# Patient Record
Sex: Female | Born: 1990 | Race: White | Hispanic: No | Marital: Single | Smoking: Never smoker
Health system: Southern US, Community
[De-identification: ages and names within clinical notes are randomized; demographics above are authoritative.]

---
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
## 2013-11-29 ENCOUNTER — Ambulatory Visit: Payer: BC Managed Care – PPO

## 2013-11-29 ENCOUNTER — Ambulatory Visit (INDEPENDENT_AMBULATORY_CARE_PROVIDER_SITE_OTHER): Payer: BC Managed Care – PPO | Admitting: Family Medicine

## 2013-11-29 VITALS — BP 94/62 | HR 76 | Temp 98.2°F | Resp 16 | Ht 66.0 in | Wt 110.0 lb

## 2013-11-29 DIAGNOSIS — M25539 Pain in unspecified wrist: Secondary | ICD-10-CM

## 2013-11-29 DIAGNOSIS — M79641 Pain in right hand: Secondary | ICD-10-CM

## 2013-11-29 DIAGNOSIS — M25531 Pain in right wrist: Secondary | ICD-10-CM

## 2013-11-29 DIAGNOSIS — M79609 Pain in unspecified limb: Secondary | ICD-10-CM

## 2013-11-29 DIAGNOSIS — S62109A Fracture of unspecified carpal bone, unspecified wrist, initial encounter for closed fracture: Secondary | ICD-10-CM

## 2013-11-29 NOTE — Progress Notes (Signed)
Chief Complaint:  Chief Complaint  Patient presents with  . Wrist Injury    right-fell 1 week ago and tried to catch herself with her wrist    HPI: Tonya Martinez is a 23 y.o. female who is here for right wrist injury. She tripped and fell on her hand on 11/23/2013. Her wrist has been weak and started hurting the more she tries to use it. She fell and extended her hand put ut. Pain is sharp pain at wrist when typing but normally 2/10 dull pain radiates to hand and forearm. She has broken that wrist in the 3rd grade managed with casting, broke her elbow on  right side 5 years elbow. Broken both wrists in the past due to traumatic injury. She has tried RICE and wrist brace. Occ weakness, numbness tingling. She has pain with typing. She denies osteoporosis/osteopenia, eating disorder  History reviewed. No pertinent past medical history. Past Surgical History  Procedure Laterality Date  . Tonsillectomy  2009  . Ankle arthroscopy with repair subluxing tendon  2006   History   Social History  . Marital Status: Single    Spouse Name: N/A    Number of Children: N/A  . Years of Education: N/A   Social History Main Topics  . Smoking status: Never Smoker   . Smokeless tobacco: None  . Alcohol Use: 0.6 oz/week    1 Glasses of wine per week  . Drug Use: No  . Sexual Activity: None   Other Topics Concern  . None   Social History Narrative  . None   Family History  Problem Relation Age of Onset  . Hyperlipidemia Mother    Allergies  Allergen Reactions  . Penicillins Hives   Prior to Admission medications   Medication Sig Start Date End Date Taking? Authorizing Provider  norethindrone-ethinyl estradiol (NECON,BREVICON,MODICON) 0.5-35 MG-MCG tablet Take 1 tablet by mouth daily.   Yes Historical Provider, MD     ROS: The patient denies fevers, chills, night sweats, unintentional weight loss, chest pain, palpitations, wheezing, dyspnea on exertion, nausea, vomiting,  abdominal pain, dysuria, hematuria, melena, numbness, weakness, or tingling.   All other systems have been reviewed and were otherwise negative with the exception of those mentioned in the HPI and as above.    PHYSICAL EXAM: Filed Vitals:   11/29/13 0848  BP: 94/62  Pulse: 76  Temp: 98.2 F (36.8 C)  Resp: 16   Filed Vitals:   11/29/13 0848  Height: 5\' 6"  (1.676 m)  Weight: 110 lb (49.896 kg)   Body mass index is 17.76 kg/(m^2).  General: Alert, no acute distress HEENT:  Normocephalic, atraumatic, oropharynx patent. EOMI, PERRLA Cardiovascular:  Regular rate and rhythm, no rubs murmurs or gallops.  No Carotid bruits, radial pulse intact. No pedal edema.  Respiratory: Clear to auscultation bilaterally.  No wheezes, rales, or rhonchi.  No cyanosis, no use of accessory musculature GI: No organomegaly, abdomen is soft and non-tender, positive bowel sounds.  No masses. Skin: No rashes. Neurologic: Facial musculature symmetric. Psychiatric: Patient is appropriate throughout our interaction. Lymphatic: No cervical lymphadenopathy Musculoskeletal: Gait intact. Right wrist tenderness, + radial pulse Decrease ROm due to pain mostly in flexiona nd extension 5/5 sterngth with ulnar and radial deviation   LABS: No results found for this or any previous visit.   EKG/XRAY:   [facility] ? Acute Distal radius fracture vs chrinic changes from prior wrist fracture  ASSESSMENT/PLAN: Encounter  Diagnoses  Name Primary?  . Right hand pain Yes  . Right wrist pain   . Wrist fracture    Pleasant 23 y/o female with possible right distal radius fracture. Will go ahead and splint her with sugar tong.  She is right hand dominant. Limit use of right hand/arm Sugar tong ,Sling xrays and official report given to patient Will refer to ortho, consider screening for osteoporosis RICE Tylenol/ibuprofen   Gross sideeffects, risk and benefits, and alternatives of  medications d/w patient. Patient is aware that all medications have potential sideeffects and we are unable to predict every sideeffect or drug-drug interaction that may occur.  LE, THAO PHUONG, DO 11/29/2013 10:31 AM   D/w patient imaging results IMPRESSION:  1. There is no evidence of an acute fracture of the carpal bones or  metacarpals or phalanges.  2. There may be an impacted fracture of the distal right radial  metaphysis. The ulnar styloid appears intact.

## 2014-03-06 ENCOUNTER — Other Ambulatory Visit (HOSPITAL_COMMUNITY)
Admission: RE | Admit: 2014-03-06 | Discharge: 2014-03-06 | Disposition: A | Discharge status: Home / Self Care | Payer: BC Managed Care – PPO | Source: Ambulatory Visit | Attending: Family Medicine | Admitting: Family Medicine

## 2014-03-06 ENCOUNTER — Other Ambulatory Visit: Payer: Self-pay | Admitting: Family Medicine

## 2014-03-06 DIAGNOSIS — Z124 Encounter for screening for malignant neoplasm of cervix: Secondary | ICD-10-CM | POA: Diagnosis present

## 2014-03-08 LAB — CYTOLOGY - PAP

## 2014-08-22 ENCOUNTER — Ambulatory Visit (INDEPENDENT_AMBULATORY_CARE_PROVIDER_SITE_OTHER): Payer: BC Managed Care – PPO | Admitting: Family Medicine

## 2014-08-22 VITALS — BP 122/76 | HR 66 | Temp 98.7°F | Resp 17 | Ht 67.0 in | Wt 113.0 lb

## 2014-08-22 DIAGNOSIS — M545 Low back pain: Secondary | ICD-10-CM

## 2014-08-22 DIAGNOSIS — M62830 Muscle spasm of back: Secondary | ICD-10-CM

## 2014-08-22 MED ORDER — CYCLOBENZAPRINE HCL 10 MG PO TABS: 10.0000 mg | ORAL_TABLET | Freq: Two times a day (BID) | ORAL | Status: DC | PRN

## 2014-08-22 NOTE — Patient Instructions (Signed)
We are going to treat you for a back spasm with flexeril (muscle relaxer) which you can use as needed.  If a whole pill is too strong try a 1/2 pill You can continue to use ibuprofen as needed.  Also use heat and move around frequently.   Let me know if you do not feel better soon!

## 2014-08-22 NOTE — Progress Notes (Signed)
[facility] [address], [place] [address] [phone]  Date:  08/22/2014   Name:  Tonya Martinez   DOB:  07/31/1991   MRN:  [id number]  PCP:  No PCP Per Patient    Chief Complaint: Back Pain   History of Present Illness:  Tonya Martinez is a 23 y.o. very pleasant female patient who presents with the following:  She is here today with lower back pain that she first noted upon awakening Monday am (today is Wednesday).   No different activities over the weekend- NKI.   The pain is more in the right lower back, and she has some slight tingling and "pressure" in her right leg.   She has not noted any weakness in her legs.  No urinary sx like dyrusia or frequency.   No bowel or bladder control issues.   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   She is married, never a smoker.  Work is winding down for the holidays so missing some work is not an isseu  There are no active problems to display for this patient.   No past medical history on file.  Past Surgical History  Procedure Laterality Date  . Tonsillectomy  2009  . Ankle arthroscopy with repair subluxing tendon  2006    History  Substance Use Topics  . Smoking status: Never Smoker   . Smokeless tobacco: Not on file  . Alcohol Use: 0.6 oz/week    1 Glasses of wine per week    Family History  Problem Relation Age of Onset  . Hyperlipidemia Mother     Allergies  Allergen Reactions  . Penicillins Hives    Medication list has been reviewed and updated.  Current Outpatient Prescriptions on File Prior to Visit  Medication Sig Dispense Refill  . norethindrone-ethinyl estradiol (NECON,BREVICON,MODICON) 0.5-35 MG-MCG tablet Take 1 tablet by mouth daily.     No current facility-administered medications on file prior to visit.    Review of Systems:  As per HPI- otherwise negative.   Physical  Examination: Filed Vitals:   08/22/14 0829  BP: 122/76  Pulse: 66  Temp: 98.7 F (37.1 C)  Resp: 17   Filed Vitals:   08/22/14 0829  Height: 5\' 7"  (1.702 m)  Weight: 113 lb (51.256 kg)   Body mass index is 17.69 kg/(m^2). Ideal Body Weight: Weight in (lb) to have BMI = 25: 159.3  GEN: WDWN, NAD, Non-toxic, A & O x 3 HEENT: Atraumatic, Normocephalic. Neck supple. No masses, No LAD. Ears and Nose: No external deformity. CV: RRR, No M/G/R. No JVD. No thrill. No extra heart sounds. PULM: CTA B, no wheezes, crackles, rhonchi. No retractions. No resp. distress. No accessory muscle use. ABD: S, NT, ND. EXTR: No c/c/e NEURO Normal gait.  PSYCH: Normally interactive. Conversant. Not depressed or anxious appearing.  Calm demeanor.  Right lower back is in spasm and tender.  restricted lumbar flexion and extension.  Negative SLR bilaterally, normal BLE strength, sensation and DTR   Assessment and Plan: Back spasm - Plan: cyclobenzaprine (FLEXERIL) 10 MG tablet  Low back pain without sciatica, unspecified back pain laterality - Plan: cyclobenzaprine (FLEXERIL) 10 MG tablet  Lower back strain- flexeril as needed, heat and ibuprofen prn.  cautined regarding sedation with flexeril  she will follow-up if not better soon- Sooner if worse.     Signed  Lamar Blinks, MD

## 2014-10-21 ENCOUNTER — Ambulatory Visit (INDEPENDENT_AMBULATORY_CARE_PROVIDER_SITE_OTHER): Payer: BC Managed Care – PPO

## 2014-10-21 ENCOUNTER — Ambulatory Visit (INDEPENDENT_AMBULATORY_CARE_PROVIDER_SITE_OTHER): Payer: BC Managed Care – PPO | Admitting: Internal Medicine

## 2014-10-21 ENCOUNTER — Other Ambulatory Visit: Payer: Self-pay | Admitting: Internal Medicine

## 2014-10-21 VITALS — BP 106/62 | HR 68 | Temp 98.2°F | Resp 16 | Ht 66.0 in | Wt 115.1 lb

## 2014-10-21 DIAGNOSIS — M79671 Pain in right foot: Secondary | ICD-10-CM

## 2014-10-21 MED ORDER — MELOXICAM 15 MG PO TABS: 15.0000 mg | ORAL_TABLET | Freq: Every day | ORAL | Status: AC

## 2014-10-21 NOTE — Progress Notes (Signed)
   Subjective:  This chart was scribed for Tonya Siaobert Dashonda Bonneau, MD by Haywood PaoNadim Abu Hashem, ED Scribe at [facility].The patient was seen in exam room 04 and the patient's care was started at 12:43 PM.   Patient ID: Tonya Martinez, female    DOB: 11/12/1990, 24 y.o.   MRN: [id number] Chief Complaint  Patient presents with  . Foot Pain    Right-No Injury that she knows of   HPI HPI Comments: Tonya Martinez is a 24 y.o. female who [facility] of right foot pain which began 4 days ago. The pain began in the middle of her foot, it has since radiated to her ankle and gradually worsened. Pain worsens when applying pressure to her foot. She woke up last night due to pain without pressure. She does not exercise, no new shoes, no long walks, nothing out of the ordinary to cause the trauma. Works at [employer], nothing that involves excessive standing or walking.  Review of Systems  Musculoskeletal: Positive for myalgias.      Objective:   Physical Exam  Constitutional: She is oriented to person, place, and time. She appears well-developed and well-nourished. No distress.  HENT:  Head: Normocephalic and atraumatic.  Eyes: Pupils are equal, round, and reactive to light.  Neck: Normal range of motion.  Cardiovascular: Normal rate and regular rhythm.   Pulmonary/Chest: Effort normal. No respiratory distress.  Musculoskeletal: Normal range of motion.  The right foot is not swollen but is TTP in the plantar fascia inserts at the calcaneous. No redness. Tenderness with foot dorsal flexion She is also exquisitely tender over the mid part of the metatarsal #4 dorsally//no swelling/squeeze test negative Achilles is nontender Ankle mortise is intact without pain on range of motion  Neurological: She is alert and oriented to person, place, and time.  Skin: Skin is warm and dry.  Psychiatric: She has a normal mood and affect. Her behavior is normal.  Nursing note and  vitals reviewed.    [facility] reading (PRIMARY) by  Dr. Merla Richesoolittle= no fracture or heel spur   Assessment & Plan:  Pain, heel, right - Plan: DG Os Calcis Right, CANCELED: DG Foot 2 Views Right  Right foot pain - Plan: CANCELED: DG Foot 2 Views Right   This should represent tendinitis of the foot and should respond to meloxicam plus ice plus stretching I have completed the patient encounter in its entirety as documented by the scribe, with editing by me where necessary. Lizbet Cirrincione P. Merla Richesoolittle, M.D.

## 2014-12-26 ENCOUNTER — Other Ambulatory Visit: Payer: BC Managed Care – PPO

## 2014-12-26 ENCOUNTER — Other Ambulatory Visit: Payer: Self-pay | Admitting: Physician Assistant

## 2014-12-26 DIAGNOSIS — R0602 Shortness of breath: Secondary | ICD-10-CM

## 2014-12-26 DIAGNOSIS — R079 Chest pain, unspecified: Secondary | ICD-10-CM

## 2015-01-24 ENCOUNTER — Ambulatory Visit (INDEPENDENT_AMBULATORY_CARE_PROVIDER_SITE_OTHER): Payer: BC Managed Care – PPO | Admitting: Emergency Medicine

## 2015-01-24 VITALS — BP 98/62 | HR 71 | Temp 98.1°F | Resp 16 | Ht 66.5 in | Wt 111.5 lb

## 2015-01-24 DIAGNOSIS — S20229A Contusion of unspecified back wall of thorax, initial encounter: Secondary | ICD-10-CM

## 2015-01-24 MED ORDER — HYDROCODONE-ACETAMINOPHEN 5-325 MG PO TABS: 1.0000 | ORAL_TABLET | ORAL | Status: AC | PRN

## 2015-01-24 NOTE — Progress Notes (Signed)
[facility] [address], [place] [address] [phone]  Date:  01/24/2015   Name:  Tonya MiresKaitlyn A Martinez   DOB:  03/17/1991   MRN:  [id number]  PCP:  No PCP Per Patient    Chief Complaint: Back Pain   History of Present Illness:  [name] is a 24 y.o. very pleasant female patient who presents with the following:  Crawling under the bed and while coming out she scraped her low back Has persistent pain that interferes with her sleeping and activities Non radiating.  No neuro symptoms. No improvement with over the counter medications or other home remedies.  Denies other complaint or health concern today.   There are no active problems to display for this patient.   History reviewed. No pertinent past medical history.  Past Surgical History  Procedure Laterality Date  . Tonsillectomy  2009  . Ankle arthroscopy with repair subluxing tendon  2006    History  Substance Use Topics  . Smoking status: Never Smoker   . Smokeless tobacco: Never Used  . Alcohol Use: 0.6 oz/week    1 Glasses of wine per week    Family History  Problem Relation Age of Onset  . Hyperlipidemia Mother     Allergies  Allergen Reactions  . Penicillins Hives    Medication list has been reviewed and updated.  Current Outpatient Prescriptions on File Prior to Visit  Medication Sig Dispense Refill  . meloxicam (MOBIC) 15 MG tablet Take 1 tablet (15 mg total) by mouth daily. 15 tablet 0  . norethindrone-ethinyl estradiol (NECON,BREVICON,MODICON) 0.5-35 MG-MCG tablet Take 1 tablet by mouth daily.     No current facility-administered medications on file prior to visit.    Review of Systems:  Review of Systems  Constitutional: Negative for fever, chills and fatigue.  HENT: Negative for congestion, ear pain, hearing loss, postnasal drip, rhinorrhea and sinus pressure.   Eyes: Negative for discharge and redness.  Respiratory: Negative for cough, shortness of  breath and wheezing.   Cardiovascular: Negative for chest pain and leg swelling.  Gastrointestinal: Negative for nausea, vomiting, abdominal pain, constipation and blood in stool.  Genitourinary: Negative for dysuria, urgency and frequency.  Musculoskeletal: Negative for neck stiffness.  Skin: Negative for rash.  Neurological: Negative for seizures, weakness and headaches.   Physical Examination: Filed Vitals:   01/24/15 1730  BP: 98/62  Pulse: 71  Temp: 98.1 F (36.7 C)  Resp: 16   Filed Vitals:   01/24/15 1730  Height: 5' 6.5" (1.689 m)  Weight: 111 lb 8 oz (50.576 kg)   Body mass index is 17.73 kg/(m^2). Ideal Body Weight: Weight in (lb) to have BMI = 25: 156.9   GEN: WDWN, NAD, Non-toxic, Alert & Oriented x 3 HEENT: Atraumatic, Normocephalic.  Ears and Nose: No external deformity. EXTR: No clubbing/cyanosis/edema NEURO: Normal gait.  PSYCH: Normally interactive. Conversant. Not depressed or anxious appearing.  Calm demeanor.  Abrasion mid back  Tender paraspinous muscles  Assessment and Plan: 1. Contusion, back, unspecified laterality, initial encounter Local heat - HYDROcodone-acetaminophen (NORCO) 5-325 MG per tablet; Take 1-2 tablets by mouth every 4 (four) hours as needed.  Dispense: 30 tablet; Refill: 0    Signed Phillips OdorJeffery Kearie Mennen, MD

## 2015-01-24 NOTE — Patient Instructions (Signed)
Contusion °A contusion is a deep bruise. Contusions are the result of an injury that caused bleeding under the skin. The contusion may turn blue, purple, or yellow. Minor injuries will give you a painless contusion, but more severe contusions may stay painful and swollen for a few weeks.  °CAUSES  °A contusion is usually caused by a blow, trauma, or direct force to an area of the body. °SYMPTOMS  °· Swelling and redness of the injured area. °· Bruising of the injured area. °· Tenderness and soreness of the injured area. °· Pain. °DIAGNOSIS  °The diagnosis can be made by taking a history and physical exam. An X-ray, CT scan, or MRI may be needed to determine if there were any associated injuries, such as fractures. °TREATMENT  °Specific treatment will depend on what area of the body was injured. In general, the best treatment for a contusion is resting, icing, elevating, and applying cold compresses to the injured area. Over-the-counter medicines may also be recommended for pain control. Ask your caregiver what the best treatment is for your contusion. °HOME CARE INSTRUCTIONS  °· Put ice on the injured area. °¨ Put ice in a plastic bag. °¨ Place a towel between your skin and the bag. °¨ Leave the ice on for 15-20 minutes, 3-4 times a day, or as directed by your health care provider. °· Only take over-the-counter or prescription medicines for pain, discomfort, or fever as directed by your caregiver. Your caregiver may recommend avoiding anti-inflammatory medicines (aspirin, ibuprofen, and naproxen) for 48 hours because these medicines may increase bruising. °· Rest the injured area. °· If possible, elevate the injured area to reduce swelling. °SEEK IMMEDIATE MEDICAL CARE IF:  °· You have increased bruising or swelling. °· You have pain that is getting worse. °· Your swelling or pain is not relieved with medicines. °MAKE SURE YOU:  °· Understand these instructions. °· Will watch your condition. °· Will get help right  away if you are not doing well or get worse. °Document Released: 06/10/2005 Document Revised: 09/05/2013 Document Reviewed: 07/06/2011 °[employer]® Patient Information ©2015 [employer]. This information is not intended to replace advice given to you by your health care provider. Make sure you discuss any questions you have with your health care provider. ° °

## 2016-08-21 IMAGING — CR DG FOOT COMPLETE 3+V*R*
3 series · 3 of 3 positions shown · non-contrast
Comparison: Right calcaneus radiographs - earlier same day

CLINICAL DATA: Right foot and heel pain.

EXAM:
RIGHT FOOT COMPLETE - 3+ VIEW

[AP]
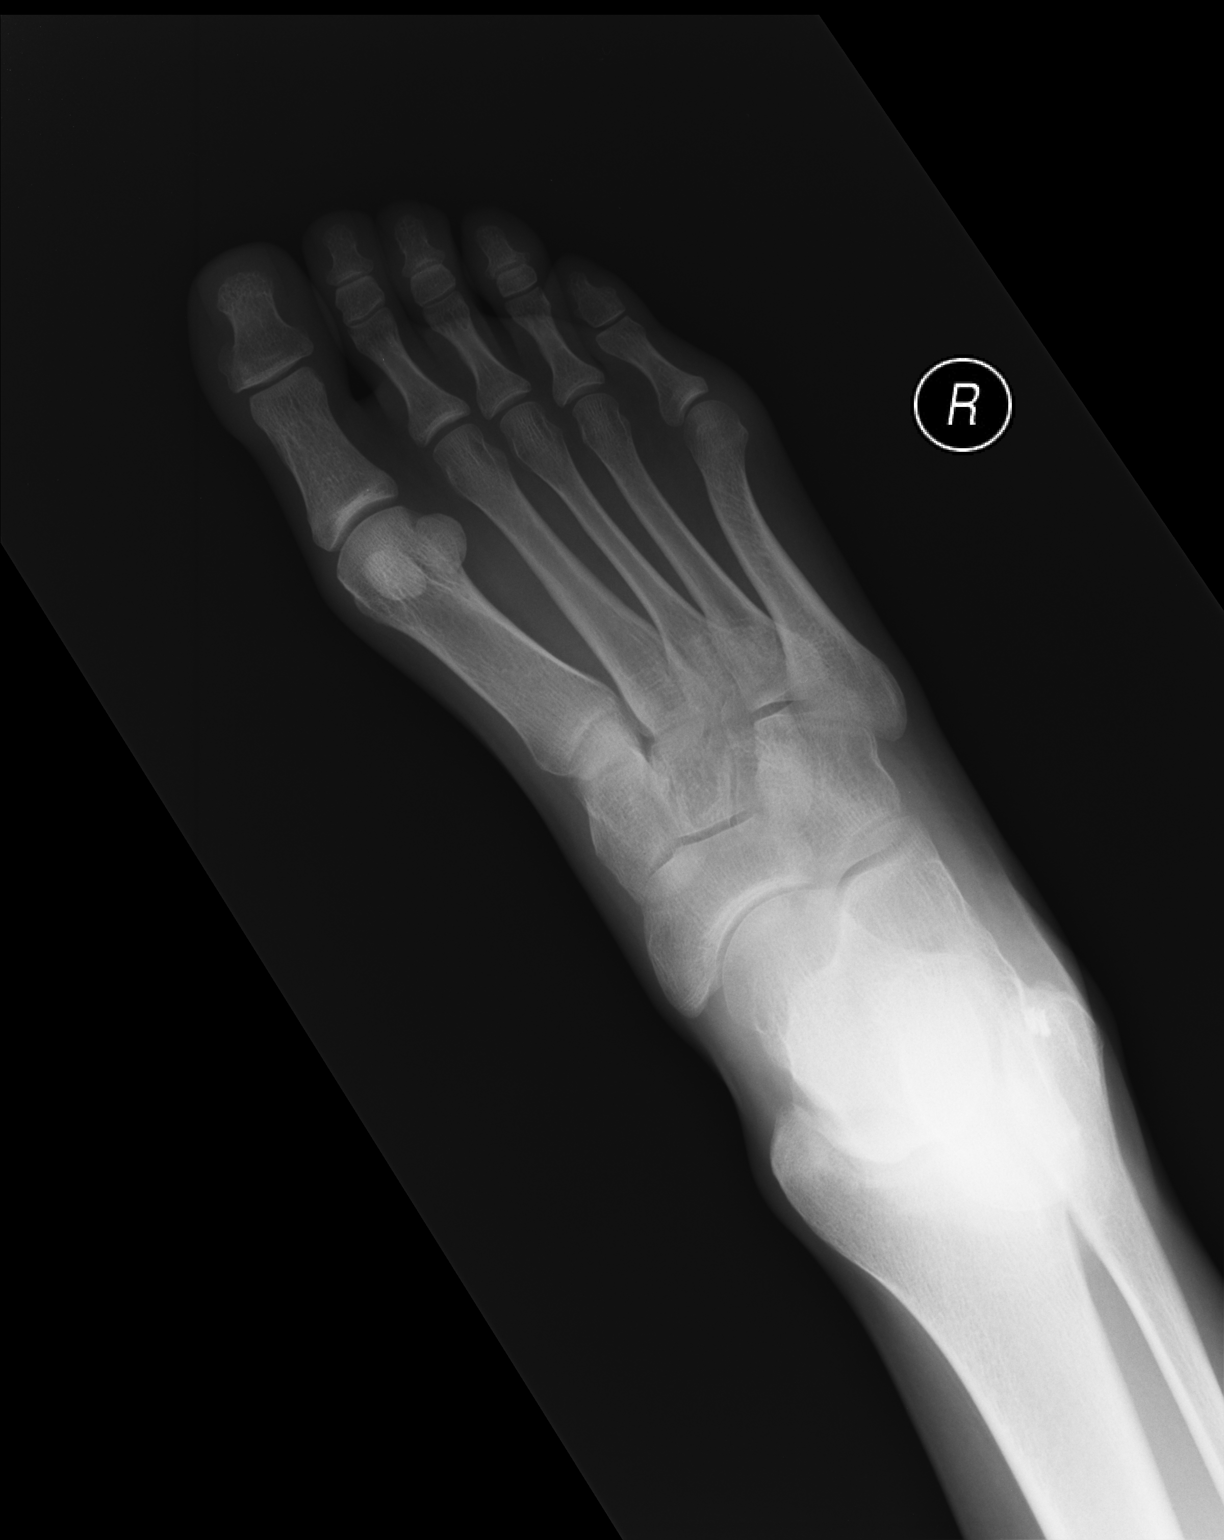

[ap obl int rot]
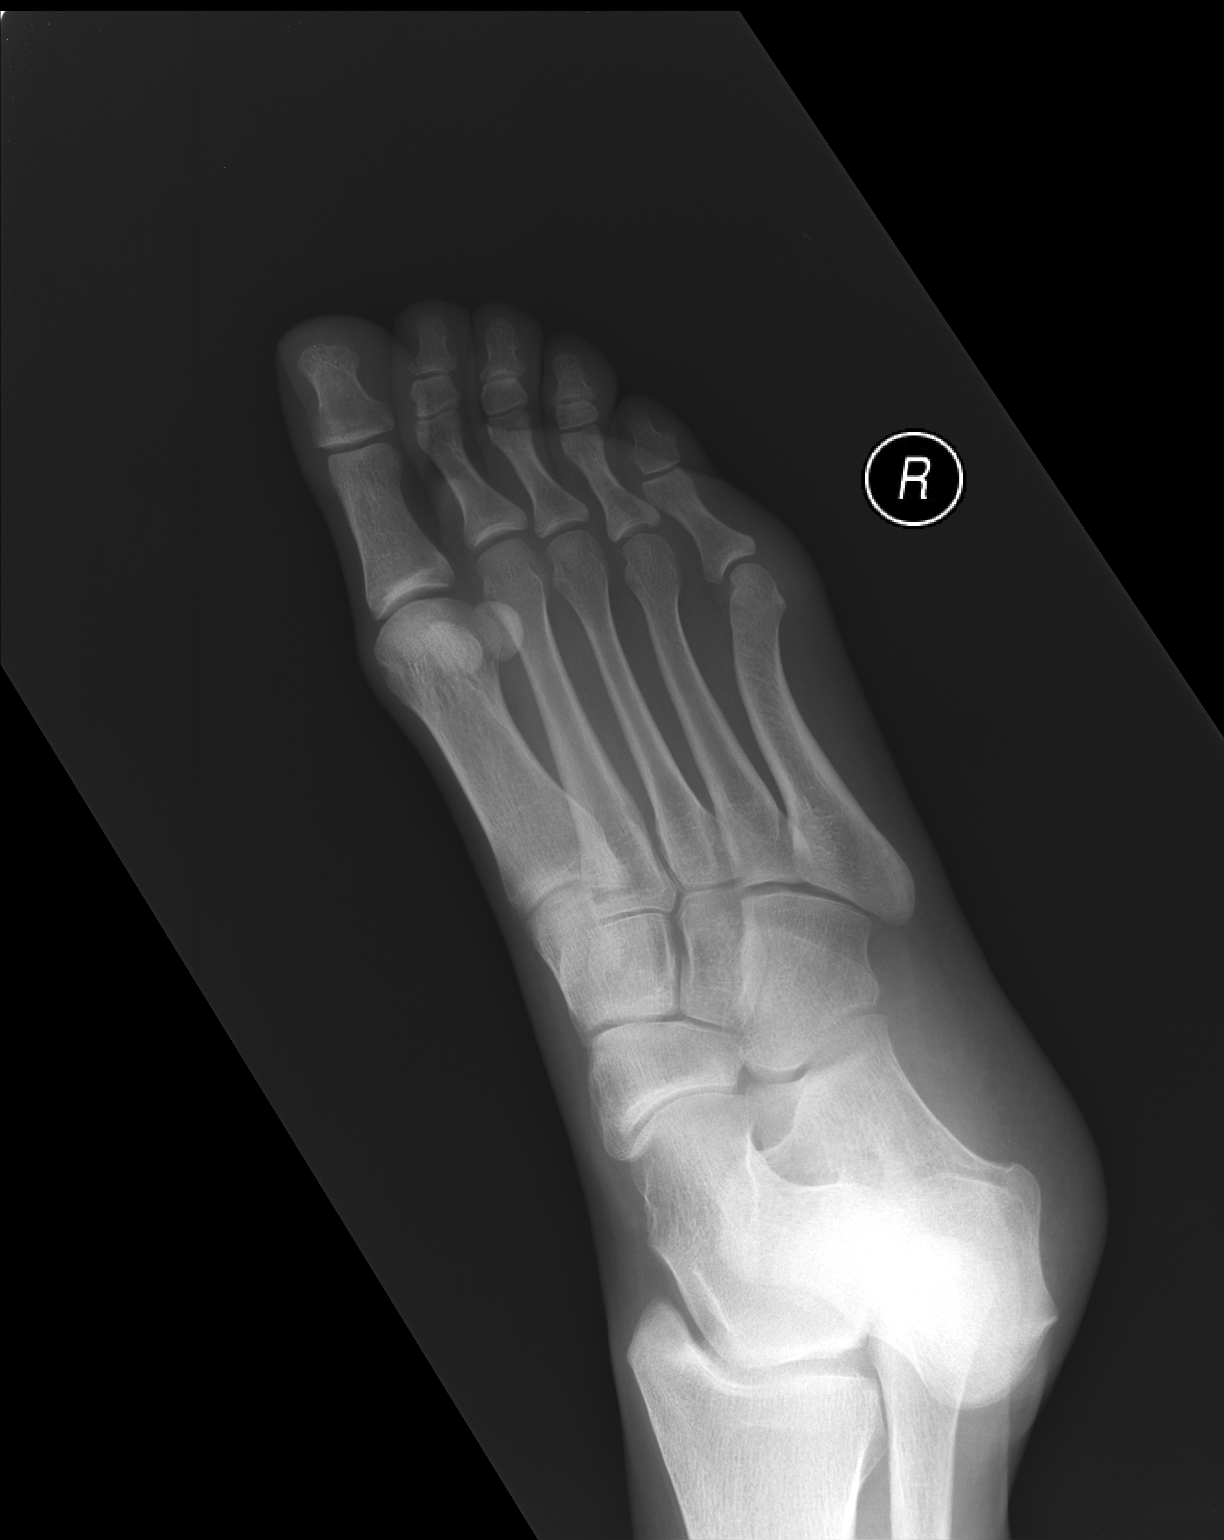

[lateral]
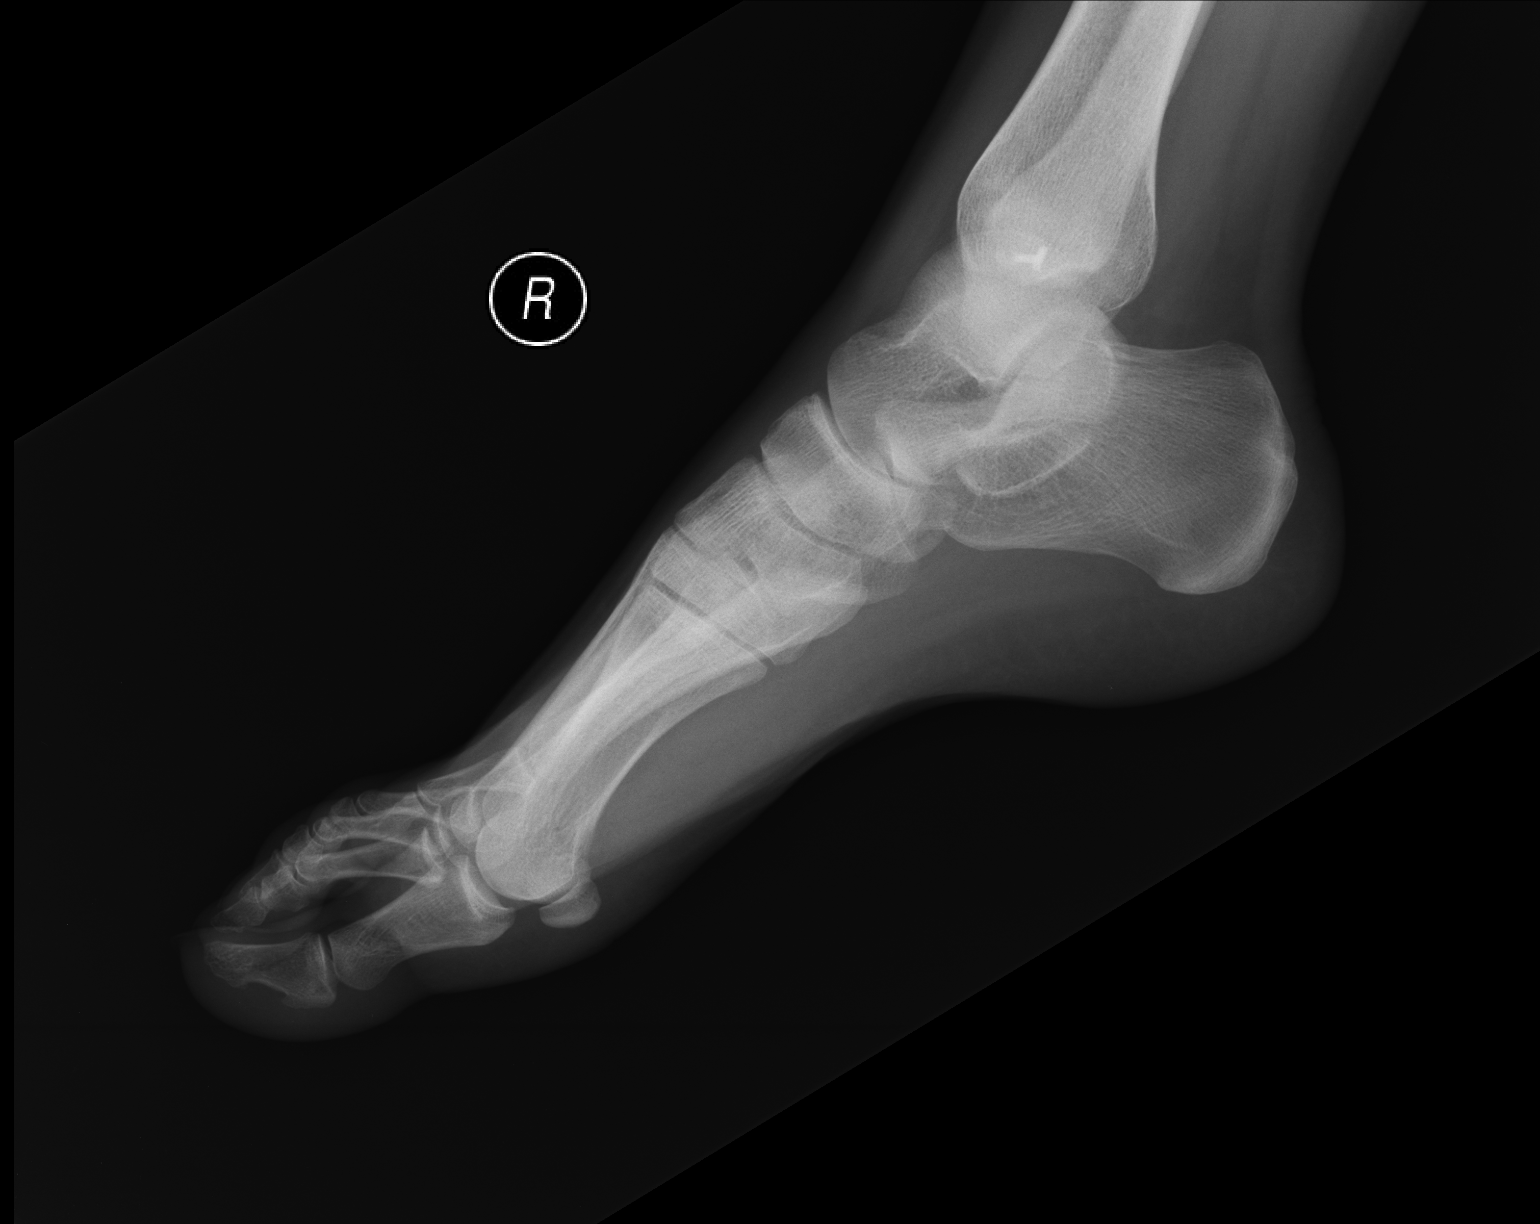

[3 of 3 positions shown; findings below may reference images not displayed]

FINDINGS: No fracture or dislocation. Joint spaces are preserved. No erosions.
Postsurgical change involving the distal end of the fibula. Regional
soft tissues appear otherwise normal. No plantar calcaneal spur.
IMPRESSION: 1. No explanation for patient's foot and heel pain.
2. Postoperative change of the distal end of the fibula,
incompletely evaluated.

## 2016-08-21 IMAGING — CR DG OS CALCIS 2+V*R*
2 series · 2 of 2 positions shown · non-contrast
Comparison: None.

CLINICAL DATA: Right foot pain which began 4 days ago.First began
last wed and gradually worsened.Began in the middle of her foot and
has since radiated to the ankle. Woke her up last night, without
pressure. She does not exercise, no new shoes, no long walks,
nothing out of the ordinary.Works at a college nothing that involves
excessive standing or walking.

EXAM:
RIGHT OS CALCIS - 2+ VIEW

[axial]
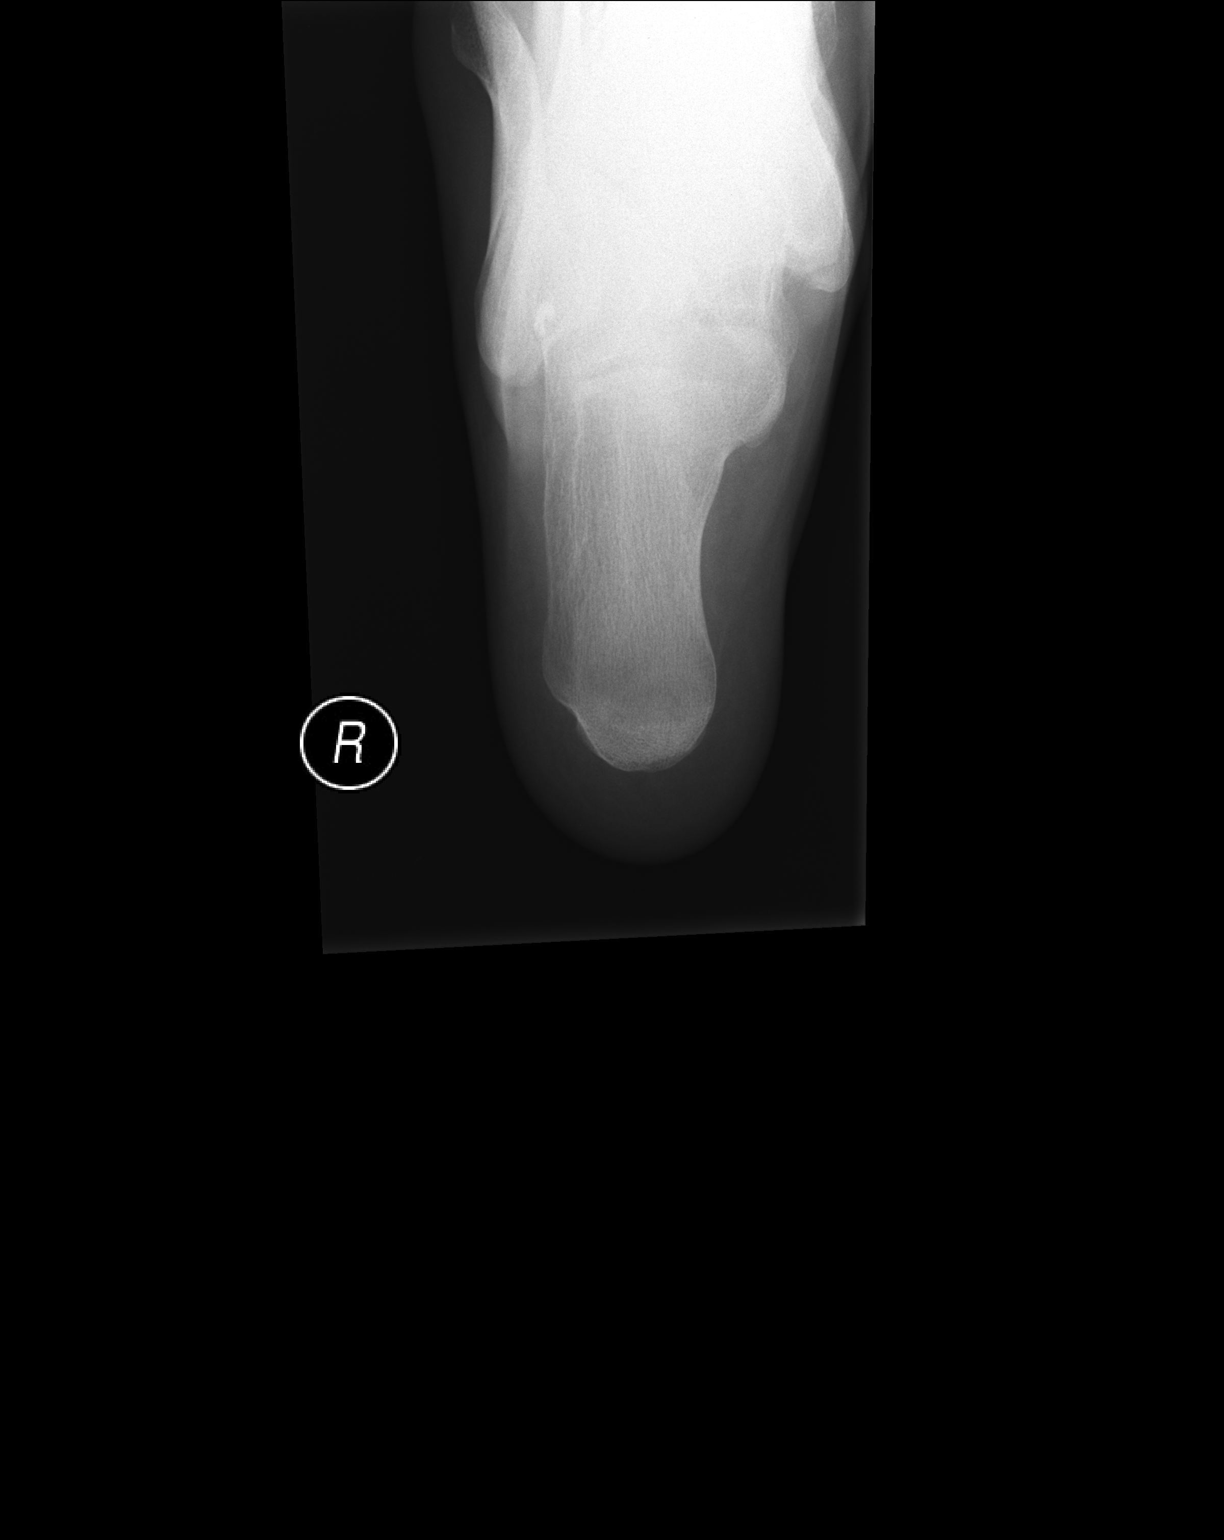

[lateral]
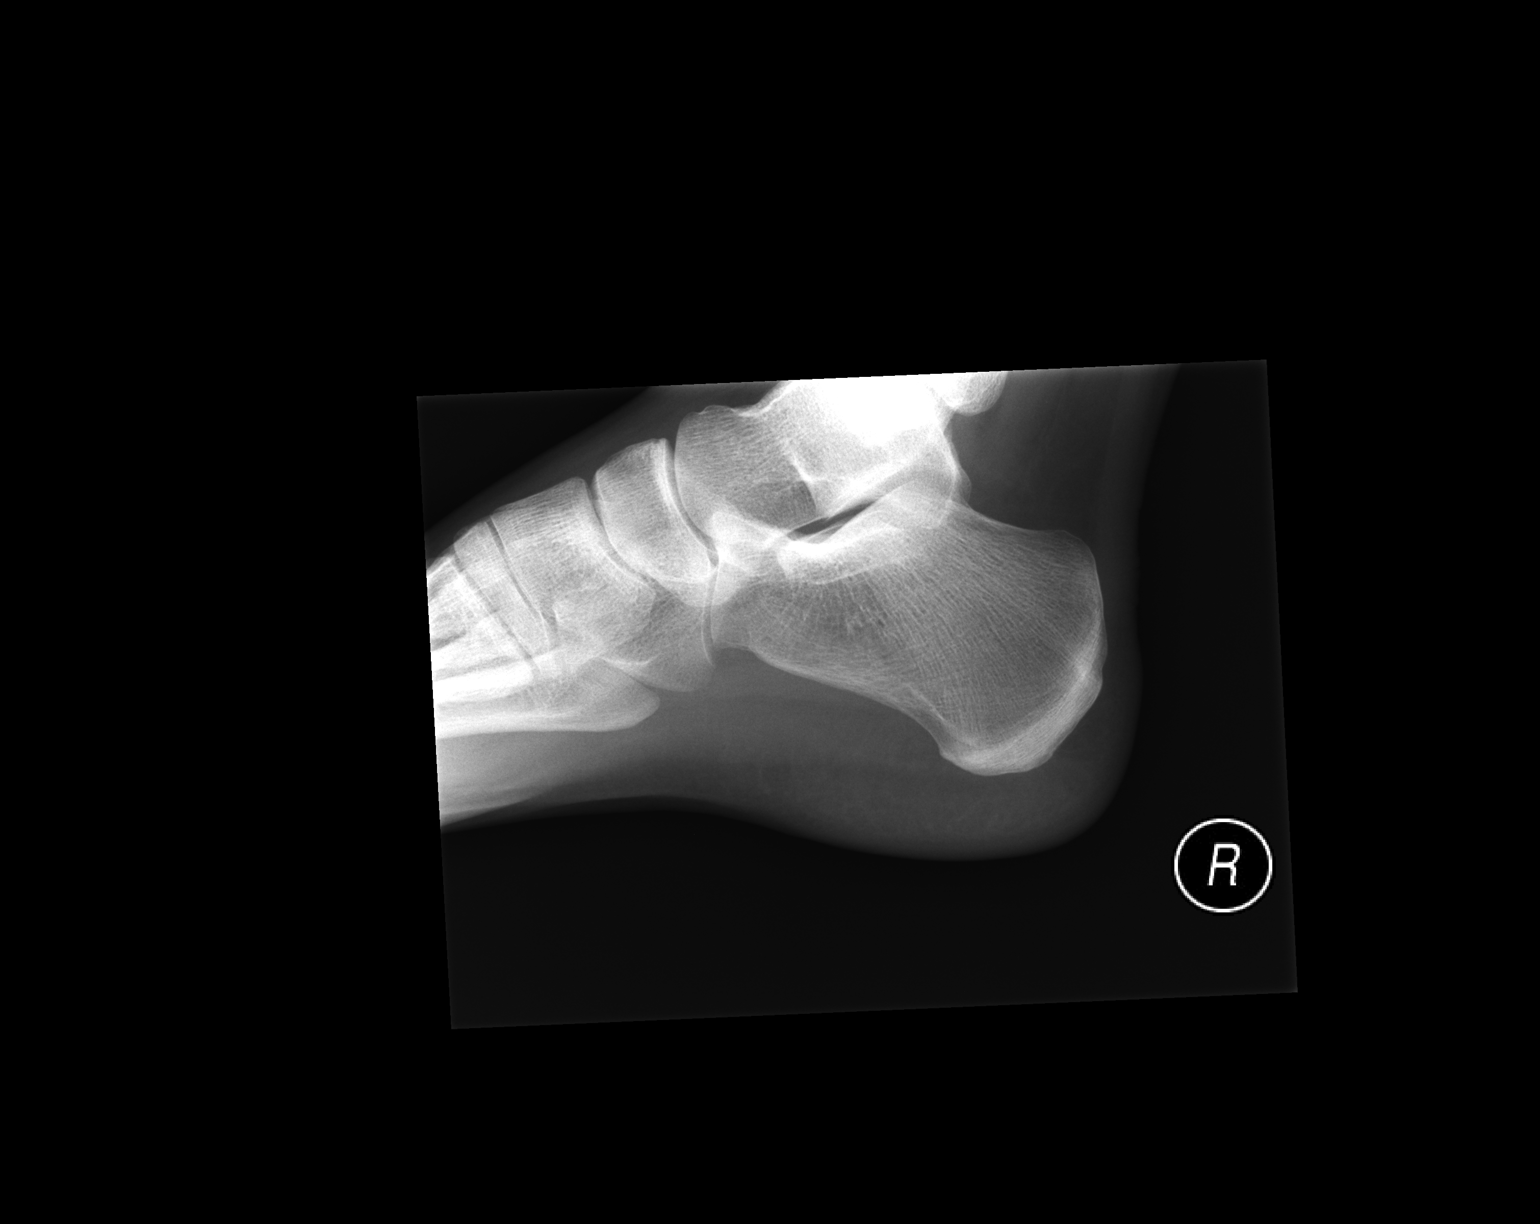

[2 of 2 positions shown; findings below may reference images not displayed]

FINDINGS: There is no evidence of fracture or other focal bone lesions. Soft
tissues are unremarkable.
IMPRESSION: No acute findings.
